# Patient Record
Sex: Male | Born: 1980 | Race: White | Hispanic: No | Marital: Married | Smoking: Former smoker
Health system: Southern US, Community
[De-identification: ages and names within clinical notes are randomized; demographics above are authoritative.]

## PROBLEM LIST (undated history)

## (undated) DIAGNOSIS — K219 Gastro-esophageal reflux disease without esophagitis: Secondary | ICD-10-CM

## (undated) DIAGNOSIS — J309 Allergic rhinitis, unspecified: Secondary | ICD-10-CM

## (undated) DIAGNOSIS — K602 Anal fissure, unspecified: Secondary | ICD-10-CM

## (undated) HISTORY — DX: Anal fissure, unspecified: K60.2

## (undated) HISTORY — DX: Gastro-esophageal reflux disease without esophagitis: K21.9

## (undated) HISTORY — DX: Allergic rhinitis, unspecified: J30.9

---
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
## 2008-09-25 HISTORY — PX: UPPER GI ENDOSCOPY: SHX6162

## 2010-02-07 ENCOUNTER — Encounter (INDEPENDENT_AMBULATORY_CARE_PROVIDER_SITE_OTHER): Payer: Self-pay | Admitting: *Deleted

## 2010-03-07 ENCOUNTER — Encounter (INDEPENDENT_AMBULATORY_CARE_PROVIDER_SITE_OTHER): Payer: Self-pay | Admitting: *Deleted

## 2010-05-05 ENCOUNTER — Encounter (INDEPENDENT_AMBULATORY_CARE_PROVIDER_SITE_OTHER): Payer: Self-pay | Admitting: *Deleted

## 2010-05-10 ENCOUNTER — Encounter (INDEPENDENT_AMBULATORY_CARE_PROVIDER_SITE_OTHER): Payer: Self-pay | Admitting: *Deleted

## 2010-05-17 ENCOUNTER — Ambulatory Visit: Payer: Self-pay | Admitting: Internal Medicine

## 2010-05-17 DIAGNOSIS — R1310 Dysphagia, unspecified: Secondary | ICD-10-CM | POA: Insufficient documentation

## 2010-05-17 DIAGNOSIS — R6889 Other general symptoms and signs: Secondary | ICD-10-CM

## 2010-05-17 DIAGNOSIS — K219 Gastro-esophageal reflux disease without esophagitis: Secondary | ICD-10-CM | POA: Insufficient documentation

## 2010-06-06 ENCOUNTER — Ambulatory Visit: Payer: Self-pay | Admitting: Internal Medicine

## 2010-06-10 ENCOUNTER — Encounter: Payer: Self-pay | Admitting: Internal Medicine

## 2010-10-25 NOTE — Letter (Signed)
Summary: Office Note-Dr E.Wilson  Office Note-Dr E.Wilson   Imported By: Lamona Curl CMA (AAMA) 05/17/2010 10:43:22  _____________________________________________________________________  External Attachment:    Type:   Image     Comment:   External Document

## 2010-10-25 NOTE — Letter (Signed)
Summary: Office Note- Dr E.Wilson  Office Note- Dr E.Wilson   Imported By: Lamona Curl CMA (AAMA) 05/17/2010 10:43:55  _____________________________________________________________________  External Attachment:    Type:   Image     Comment:   External Document

## 2010-10-25 NOTE — Letter (Signed)
Summary: Office Note-Dr E.Wilson  Office Note-Dr E.Wilson   Imported By: Lamona Curl CMA (AAMA) 05/17/2010 10:44:22  _____________________________________________________________________  External Attachment:    Type:   Image     Comment:   External Document

## 2010-10-25 NOTE — Letter (Signed)
Summary: New Patient letter  [facility]  [address] [place], [place] [address]   Phone: [phone]  Fax: [phone]       05/10/2010 MRN: [id number]  [facility] [address] [place], [place]  [address]  Dear Jesse Nolan,  Welcome to the Gastroenterology Division at [facility].    You are scheduled to see Dr. Christella Hartigan on 06-28-10 at 2:30p.m. on the 3rd floor at [facility], [address]  We ask that you try to arrive at our office 15 minutes prior to your appointment time to allow for check-in.  We would like you to complete the enclosed self-administered evaluation form prior to your visit and bring it with you on the day of your appointment.  We will review it with you.  Also, please bring a complete list of all your medications or, if you prefer, bring the medication bottles and we will list them.  Please bring your insurance card so that we may make a copy of it.  If your insurance requires a referral to see a specialist, please bring your referral form from your primary care physician.  Co-payments are due at the time of your visit and may be paid by cash, check or credit card.     Your office visit will consist of a consult with your physician (includes a physical exam), any laboratory testing he/she may order, scheduling of any necessary diagnostic testing (e.g. x-ray, ultrasound, CT-scan), and scheduling of a procedure (e.g. Endoscopy, Colonoscopy) if required.  Please allow enough time on your schedule to allow for any/all of these possibilities.    If you cannot keep your appointment, please call [phone] to cancel or reschedule prior to your appointment date.  This allows [place] the opportunity to schedule an appointment for another patient in need of care.  If you do not cancel or reschedule by 5 p.m. the business day prior to your appointment date, you will be charged a $50.00 late cancellation/no-show fee.    Thank you for choosing [facility] for your medical needs.  We appreciate the opportunity to care for you.  Please visit [place] at our website  to learn more about our practice.                     Sincerely,                                                             The Gastroenterology Division

## 2010-10-25 NOTE — Procedures (Signed)
Summary: Upper Endoscopy  Patient: [name] Note: All result statuses are Final unless otherwise noted.  Tests: (1) Upper Endoscopy (EGD)   EGD Upper Endoscopy       DONE (C)     [facility]     [address]     [place], [place]  [address]           ENDOSCOPY PROCEDURE REPORT           PATIENT:  Jesse Nolan, Jesse Nolan  MR#:  [id number]     BIRTHDATE:  [birth year], 29 yrs. old  GENDER:  male           ENDOSCOPIST:  Hedwig Morton. Juanda Chance, MD     Referred by:  [facility]           PROCEDURE DATE:  06/06/2010     PROCEDURE:  EGD with biopsy     ASA CLASS:  Class I     INDICATIONS:  lump senstation in the throat started after an     episode of vomiting ( too much EtOH). resolved on double dose of     PPI, returnes today, lifts heavy stuff at work, eats late at night                 MEDICATIONS:   Versed 8 mg, Fentanyl 75 mcg     TOPICAL ANESTHETIC:  Exactacain Spray           DESCRIPTION OF PROCEDURE:   After the risks benefits and     alternatives of the procedure were thoroughly explained, informed     consent was obtained.  The LB GIF-H180 [id number] endoscope was     introduced through the mouth and advanced to the second portion of     the duodenum, without limitations.  The instrument was slowly     withdrawn as the mucosa was fully examined.     <<PROCEDUREIMAGES>>           Duodenitis was found. patchy erosions duodenal bulb With standard     forceps, a biopsy was obtained and sent to pathology. r/o H (see     image2, image5, image6, and image7).Pylori  Otherwise the     examination was normal. With standard forceps, a biopsy was     obtained and sent to pathology (see image1 and image8). Bx normal     z-line    Retroflexed views revealed no abnormalities.    The     scope was then withdrawn from the patient and the procedure     completed.           COMPLICATIONS:  None           ENDOSCOPIC IMPRESSION:     1) Duodenitis     2) Otherwise normal  examination     RECOMMENDATIONS:     1) Await biopsy results     continue PPI's/ adjust the dose           REPEAT EXAM:  In 0 year(s) for.           ______________________________     Hedwig Morton. Juanda Chance, MD           CC: Josephina Shih, MD           n.     REVISED:  06/06/2010 04:02 PM     eSIGNED:   Hedwig Morton. Brodie at 06/06/2010 04:02 PM           [name],  [id number]  Note: An exclamation mark (!) indicates a result that was not dispersed into the flowsheet. Document Creation Date: 06/06/2010 4:03 PM _______________________________________________________________________  (1) Order result status: Final Collection or observation date-time: 06/06/2010 15:33 Requested date-time:  Receipt date-time:  Reported date-time:  Referring Physician:   Ordering Physician: Lina Sar [id number]) Specimen Source:  Source: [name] Order Number: [id number] Lab site:

## 2010-10-25 NOTE — Letter (Signed)
Summary: EGD Instructions  [facility]  [address] [place], [place] [address]   Phone: [phone]  Fax: [phone]       Jesse Nolan    06/17/1981    MRN: [id number]       Procedure Day /Date: Monday 06/06/10     Arrival Time: 2:00 pmx     Procedure Time: 3:00 pm     Location of Procedure:                    _ x _ [facility] (4th Floor)  PREPARATION FOR ENDOSCOPY   On 06/06/10 THE DAY OF THE PROCEDURE:  1.   No solid foods, milk or milk products are allowed after midnight the night before your procedure.  2.   Do not drink anything colored red or purple.  Avoid juices with pulp.  No orange juice.  3.  You may drink clear liquids until 1:00 pm, which is 2 hours before your procedure.                                                                                                CLEAR LIQUIDS INCLUDE: Water Jello Ice Popsicles Tea (sugar ok, no milk/cream) Powdered fruit flavored drinks Coffee (sugar ok, no milk/cream) Gatorade Juice: apple, white grape, white cranberry  Lemonade Clear bullion, consomm, broth Carbonated beverages (any kind) Strained chicken noodle soup Hard Candy   MEDICATION INSTRUCTIONS  Unless otherwise instructed, you should take regular prescription medications with a small sip of water as early as possible the morning of your procedure.                  OTHER INSTRUCTIONS  You will need a responsible adult at least 29 years of age to accompany you and drive you home.   This person must remain in the waiting room during your procedure.  Wear loose fitting clothing that is easily removed.  Leave jewelry and other valuables at home.  However, you may wish to bring a book to read or an iPod/MP3 player to listen to music as you wait for your procedure to start.  Remove all body piercing jewelry and leave at home.  Total time from sign-in until discharge is approximately 2-3 hours.  You should go  home directly after your procedure and rest.  You can resume normal activities the day after your procedure.  The day of your procedure you should not:   Drive   Make legal decisions   Operate machinery   Drink alcohol   Return to work  You will receive specific instructions about eating, activities and medications before you leave.    The above instructions have been reviewed and explained to me by   Lamona Curl CMA Duncan Dull)  May 17, 2010 1:15 PM     I fully understand and can verbalize these instructions _____________________________ Date 05/17/10

## 2010-10-25 NOTE — Letter (Signed)
Summary: Patient [facility] Biopsy Results  [facility]  [address] [place], [place] [address]   Phone: [phone]  Fax: [phone]        June 10, 2010 MRN: [id number]    ALDON HENGST [address] [place], [place]  [address]    Dear Mr. Randol,  I am pleased to inform you that the biopsies taken during your recent endoscopic examination did not show any evidence of cancer upon pathologic examination.it showed mild inflammation in the duodenum.  Additional information/recommendations:  __No further action is needed at this time.  Please follow-up with      your primary care physician for your other healthcare needs.  __ Please call [phone] to schedule a return visit to review      your condition.  _x_ Continue with the treatment plan as outlined on the day of your      exam.  _   Please call us if you are having persistent problems or have questions about your condition that have not been fully answered at this time.  Sincerely,  Hart Carwin MD  This letter has been electronically signed by your physician.  Appended Document: Patient Notice-Endo Biopsy Results letter mailed

## 2010-10-25 NOTE — Assessment & Plan Note (Signed)
Summary: dysphagia   History of Present Illness Visit Type: new patient  Primary GI MD: Lina Sar MD Primary Provider: [facility]  Requesting Provider: na Chief Complaint: Feels like something is stuck in his throat when he swallows  History of Present Illness:   30 y.o WM with several month hx of "lump " in his throat, which developed after an episode of forcefull vomiting in March 2011 after having a lot to drink. He initially responded to Omeprazole 20 mg once daily, but later signs's returned and have not responder to another course of prilosec. He has been evaluated by Dr Rosamaria Lints specialist. He denies dysphagia. Pt denies taking any NSAID's, he usually eats late at night and goes to bed within 2 hours of eating supper. 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 He was referred to see a GI specialist for possible EGD.   GI Review of Systems      Denies abdominal pain, acid reflux, belching, bloating, chest pain, dysphagia with liquids, dysphagia with solids, heartburn, loss of appetite, nausea, vomiting, vomiting blood, weight loss, and  weight gain.        Denies anal fissure, black tarry stools, change in bowel habit, constipation, diarrhea, diverticulosis, fecal incontinence, heme positive stool, hemorrhoids, irritable bowel syndrome, jaundice, light color stool, liver problems, rectal bleeding, and  rectal pain.    Current Medications (verified): 1)  Omeprazole 20 Mg Cpdr (Omeprazole) .... Take 1 Tablet By Mouth Once Daily  Allergies (verified): No Known Drug Allergies  Past History:  Past Medical History: DYSPHAGIA UNSPECIFIED (ICD-787.20) OTHER SYMPTOMS INVOLVING HEAD AND NECK (ICD-784.99) GERD (ICD-530.81)    Past Surgical History: Wisdom Teeth Wisdom   Family History: Family History of Colon Cancer:  ? Maternal Aunt Family History of Diabetes: Maternal Aunt Family History of Heart Disease:  Father  Social History: Married No childern Patient has never smoked. ---did "dip" until 1 month ago Occupation: Financial planner (Marketing executive) Alcohol Use - yes: one daily  Daily Caffeine Use: one daily  Illicit Drug Use - no Drug Use:  no  Review of Systems       The patient complains of fever, skin rash, and sleeping problems.  The patient denies allergy/sinus, anemia, anxiety-new, arthritis/joint pain, back pain, blood in urine, breast changes/lumps, change in vision, confusion, cough, coughing up blood, depression-new, fainting, fatigue, headaches-new, hearing problems, heart murmur, heart rhythm changes, itching, muscle pains/cramps, night sweats, nosebleeds, shortness of breath, sore throat, swelling of feet/legs, swollen lymph glands, thirst - excessive, urination - excessive, urination changes/pain, urine leakage, vision changes, and voice change.         Pertinent positive and negative review of systems were noted in the above HPI. All other ROS was otherwise negative.   Vital Signs:  Patient profile:   30 year old male Height:      74 inches Weight:      269 pounds BMI:     34.66 BSA:     2.47 Temp:     98.7 degrees F oral Pulse rate:   88 / minute Pulse rhythm:   regular BP sitting:   144 / 76  (left arm) Cuff size:   regular  Vitals Entered By: Ok Anis CMA (May 17, 2010 12:25 PM)  Physical Exam  General:  Well developed, well nourished, no acute distress., mildly overweight Eyes:  PERRLA, no icterus. Mouth:  No deformity or lesions, dentition normal. Neck:  Supple; no masses  or thyromegaly. Lungs:  Clear throughout to auscultation. Heart:  Regular rate and rhythm; no murmurs, rubs,  or bruits. Abdomen:  Soft, nontender and nondistended. No masses, hepatosplenomegaly or hernias noted. Normal bowel sounds. Extremities:  No clubbing, cyanosis, edema or deformities noted. Skin:  Intact without significant lesions or rashes. Psych:  Alert and cooperative.  Normal mood and affect.   Impression & Recommendations:  Problem # 1:  DYSPHAGIA UNSPECIFIED (ICD-787.20) globus sensation in the back of his throat, no true dysphadia, possible related to LPR, possibly due to silent nocturnal reflux. R/o Barrett's esophagus, hiatal hernia, Only partiao response to PPI. Will increase prilosec to 20 mg am and 40 mg pm and schedule pt for EGD abd biopsies. Antireflux measures discussed with the pt and his mother. Orders: EGD (EGD)  Patient Instructions: 1)  You have been scheduled for an endoscopy on 06/06/10 @ 3 pm. Please arrive at 2 pm for registration. You have written instructions for your prep. 2)  We have sent a new prescription for omeprazole to your pharmacy for you to pick up. You should take 1/2 tablet (20 mg) every morning and 1 tablet (40 mg) every night. We have given you some samples as well. 3)  Copy sent to : [facility] 4)  The medication list was reviewed and reconciled.  All changed / newly prescribed medications were explained.  A complete medication list was provided to the patient / caregiver. Prescriptions: PRILOSEC 40 MG CPDR (OMEPRAZOLE) Take 1/2 tablet by mouth by mouth every morning and 1 tablet by mouth at bedtime  #45 x 2   Entered by:   Lamona Curl CMA (AAMA)   Authorized by:   Hart Carwin MD   Signed by:   Lamona Curl CMA (AAMA) on 05/17/2010   Method used:   Electronically to        [facility]  [address] [id number] (retail)       [address]       [place], [place]  [address]       Ph: [phone] or [phone]       Fax: [phone]   RxID:   [id number]

## 2010-11-10 ENCOUNTER — Emergency Department (HOSPITAL_COMMUNITY): Payer: BC Managed Care – PPO

## 2010-11-10 ENCOUNTER — Emergency Department (HOSPITAL_COMMUNITY)
Admission: EM | Admit: 2010-11-10 | Discharge: 2010-11-10 | Disposition: A | Discharge status: Home / Self Care | Payer: BC Managed Care – PPO | Attending: Emergency Medicine | Admitting: Emergency Medicine

## 2010-11-10 DIAGNOSIS — R079 Chest pain, unspecified: Secondary | ICD-10-CM | POA: Insufficient documentation

## 2010-11-10 DIAGNOSIS — R091 Pleurisy: Secondary | ICD-10-CM | POA: Insufficient documentation

## 2011-09-26 HISTORY — PX: COLONOSCOPY: SHX5424

## 2013-01-03 ENCOUNTER — Ambulatory Visit (HOSPITAL_COMMUNITY)
Admission: RE | Admit: 2013-01-03 | Discharge: 2013-01-03 | Disposition: A | Discharge status: Home / Self Care | Payer: BC Managed Care – PPO | Source: Ambulatory Visit | Attending: Family Medicine | Admitting: Family Medicine

## 2013-01-03 ENCOUNTER — Other Ambulatory Visit (HOSPITAL_COMMUNITY): Payer: Self-pay | Admitting: Family Medicine

## 2013-01-03 DIAGNOSIS — R05 Cough: Secondary | ICD-10-CM

## 2013-01-03 DIAGNOSIS — R059 Cough, unspecified: Secondary | ICD-10-CM | POA: Insufficient documentation

## 2013-02-24 ENCOUNTER — Other Ambulatory Visit (HOSPITAL_COMMUNITY): Payer: Self-pay | Admitting: Family Medicine

## 2013-02-24 DIAGNOSIS — J209 Acute bronchitis, unspecified: Secondary | ICD-10-CM

## 2013-02-24 DIAGNOSIS — R0789 Other chest pain: Secondary | ICD-10-CM

## 2013-02-27 ENCOUNTER — Encounter (HOSPITAL_COMMUNITY): Payer: Self-pay

## 2013-02-27 ENCOUNTER — Ambulatory Visit (HOSPITAL_COMMUNITY): Payer: BC Managed Care – PPO

## 2013-02-27 ENCOUNTER — Ambulatory Visit (HOSPITAL_COMMUNITY)
Admission: RE | Admit: 2013-02-27 | Discharge: 2013-02-27 | Disposition: A | Discharge status: Home / Self Care | Payer: BC Managed Care – PPO | Source: Ambulatory Visit | Attending: Family Medicine | Admitting: Family Medicine

## 2013-02-27 DIAGNOSIS — J209 Acute bronchitis, unspecified: Secondary | ICD-10-CM | POA: Insufficient documentation

## 2013-02-27 DIAGNOSIS — R0789 Other chest pain: Secondary | ICD-10-CM

## 2013-02-27 DIAGNOSIS — R079 Chest pain, unspecified: Secondary | ICD-10-CM | POA: Insufficient documentation

## 2013-02-27 MED ORDER — IOHEXOL 300 MG/ML  SOLN
80.0000 mL | Freq: Once | INTRAMUSCULAR | Status: AC | PRN
  Administered 2013-02-27: 80 mL via INTRAVENOUS

## 2014-01-30 ENCOUNTER — Ambulatory Visit (INDEPENDENT_AMBULATORY_CARE_PROVIDER_SITE_OTHER): Payer: PRIVATE HEALTH INSURANCE | Admitting: General Surgery

## 2014-01-30 ENCOUNTER — Encounter (INDEPENDENT_AMBULATORY_CARE_PROVIDER_SITE_OTHER): Payer: Self-pay | Admitting: General Surgery

## 2014-01-30 VITALS — BP 132/88 | HR 80 | Temp 97.4°F | Resp 14 | Ht 73.0 in | Wt 264.0 lb

## 2014-01-30 DIAGNOSIS — R1031 Right lower quadrant pain: Secondary | ICD-10-CM | POA: Insufficient documentation

## 2014-01-30 NOTE — Patient Instructions (Signed)
Will call with results of CT 

## 2014-01-30 NOTE — Progress Notes (Signed)
Patient ID: Jesse Nolan, male   DOB: December 26, 1980, 33 y.o.   MRN: [id number]  Chief Complaint  Patient presents with  . New Evaluation    eval RIH    HPI Jesse Nolan is a 33 y.o. male.  We are asked to see the patient in consultation by Dr. Terie PurserSamantha Jackson to evaluate him for a right inguinal hernia. The patient is a 33 year old white male who presents with some right groin pain that started in January. He first noticed this after splitting some blood. He has not noticed a bulge in the right groin. He denies any nausea or vomiting. Over the last couple weeks the pain has resolved and he feels pretty normal. His appetite is good and his bowels are working normally  HPI  Past Medical History  Diagnosis Date  . GERD (gastroesophageal reflux disease)     Past Surgical History  Procedure Laterality Date  . Colonoscopy  2013    Dr. Candelaria StagersMeadoff  . Wisdom tooth extraction  2002  . Upper gi endoscopy  2010    Dr. Dickie LaBrody    History reviewed. No pertinent family history.  Social History History  Substance Use Topics  . Smoking status: Former Smoker    Quit date: 01/30/2005  . Smokeless tobacco: Not on file  . Alcohol Use: 3.0 - 3.6 oz/week    5-6 Cans of beer per week     Comment: weekly    No Known Allergies  Current Outpatient Prescriptions  Medication Sig Dispense Refill  . cetirizine (ZYRTEC) 10 MG tablet Take 10 mg by mouth daily.      Marland Kitchen. KRILL OIL OMEGA-3 PO Take by mouth.      . Multiple Vitamins-Minerals (MULTIVITAMIN WITH MINERALS) tablet Take 1 tablet by mouth daily.      Marland Kitchen. omeprazole (PRILOSEC) 40 MG capsule        No current facility-administered medications for this visit.    Review of Systems Review of Systems  Constitutional: Negative.   HENT: Negative.   Eyes: Negative.   Respiratory: Negative.   Cardiovascular: Negative.   Gastrointestinal: Negative.   Endocrine: Negative.   Genitourinary: Negative.   Musculoskeletal: Negative.   Skin: Negative.    Allergic/Immunologic: Negative.   Neurological: Negative.   Hematological: Negative.   Psychiatric/Behavioral: Negative.     Blood pressure 132/88, pulse 80, temperature 97.4 F (36.3 C), temperature source Temporal, resp. rate 14, height 6\' 1"  (1.854 m), weight 264 lb (119.75 kg).  Physical Exam Physical Exam  Constitutional: He is oriented to person, place, and time. He appears well-developed and well-nourished.  HENT:  Head: Normocephalic and atraumatic.  Eyes: Conjunctivae and EOM are normal. Pupils are equal, round, and reactive to light.  Neck: Normal range of motion. Neck supple.  Cardiovascular: Normal rate, regular rhythm and normal heart sounds.   Pulmonary/Chest: Effort normal and breath sounds normal.  Abdominal: Soft. Bowel sounds are normal.  Genitourinary:  There is no palpable bulge in either groin. There is no palpable impulse with straining in either groin.  Musculoskeletal: Normal range of motion.  Neurological: He is alert and oriented to person, place, and time.  Skin: Skin is warm and dry.  Psychiatric: He has a normal mood and affect. His behavior is normal.    Data Reviewed As above  Assessment    The patient has had some right groin discomfort. Clinically I cannot appreciate an inguinal hernia today. Because of this I would recommend getting a limited CT of the pelvis to  look for any radiographic evidence of a hernia. I discussed with him in detail the risks and benefits of the operation to fix her hernia as well as some of the technical aspects including the use of mesh and he understands     Plan    Plan for limited CT of the pelvis. We will call him with the results of the study and then proceed accordingly        Caleen EssexPaul S Toth III 01/30/2014, 4:49 PM

## 2014-02-02 ENCOUNTER — Telehealth (INDEPENDENT_AMBULATORY_CARE_PROVIDER_SITE_OTHER): Payer: Self-pay | Admitting: *Deleted

## 2014-02-02 NOTE — Telephone Encounter (Signed)
Pt wife, Lupe CarneyRebekah, called back regarding the message that was left.  I relayed the apt information to her regarding the CT and she stated that he couldn't do anything until after 4:00 p.m   I provided her with GI phone number and she will call and reschedule to best suit the patient.  Victorino DikeJennifer

## 2014-02-02 NOTE — Telephone Encounter (Signed)
LM for pt to return my call.  Please advise pt of his appt for CT Pelvis without contrast @ [facility] 02/04/14 @ 1:45.  Pt needs to drink 1st bottle of contrast at 12:00 and second bottle at 1:00.  Please advise pt no solid foods 4 hours prior to test.  Thanks!  Jesse DikeJennifer

## 2014-02-04 ENCOUNTER — Ambulatory Visit
Admission: RE | Admit: 2014-02-04 | Discharge: 2014-02-04 | Disposition: A | Discharge status: Home / Self Care | Payer: BC Managed Care – PPO | Source: Ambulatory Visit | Attending: General Surgery | Admitting: General Surgery

## 2014-02-04 ENCOUNTER — Inpatient Hospital Stay: Admission: RE | Admit: 2014-02-04 | Payer: BC Managed Care – PPO | Source: Ambulatory Visit

## 2014-02-04 DIAGNOSIS — R1031 Right lower quadrant pain: Secondary | ICD-10-CM

## 2014-02-09 ENCOUNTER — Telehealth (INDEPENDENT_AMBULATORY_CARE_PROVIDER_SITE_OTHER): Payer: Self-pay

## 2014-02-09 NOTE — Telephone Encounter (Signed)
Message copied by Brennan BaileyBROOKS, Satina Jerrell on Mon Feb 09, 2014  4:17 PM ------      Message from: Caleen EssexTH, PAUL S III      Created: Thu Feb 05, 2014  1:29 PM       No hernia seen ------

## 2014-02-09 NOTE — Telephone Encounter (Signed)
Called pt with Ct report

## 2014-05-11 ENCOUNTER — Encounter: Payer: Self-pay | Admitting: Internal Medicine

## 2015-02-15 ENCOUNTER — Emergency Department (HOSPITAL_COMMUNITY): Payer: BLUE CROSS/BLUE SHIELD

## 2015-02-15 ENCOUNTER — Encounter (HOSPITAL_COMMUNITY): Payer: Self-pay | Admitting: *Deleted

## 2015-02-15 ENCOUNTER — Emergency Department (HOSPITAL_COMMUNITY)
Admission: EM | Admit: 2015-02-15 | Discharge: 2015-02-15 | Disposition: A | Discharge status: Home / Self Care | Payer: BLUE CROSS/BLUE SHIELD | Attending: Emergency Medicine | Admitting: Emergency Medicine

## 2015-02-15 DIAGNOSIS — M549 Dorsalgia, unspecified: Secondary | ICD-10-CM | POA: Insufficient documentation

## 2015-02-15 DIAGNOSIS — Z87891 Personal history of nicotine dependence: Secondary | ICD-10-CM | POA: Insufficient documentation

## 2015-02-15 DIAGNOSIS — Z79899 Other long term (current) drug therapy: Secondary | ICD-10-CM | POA: Insufficient documentation

## 2015-02-15 DIAGNOSIS — R079 Chest pain, unspecified: Secondary | ICD-10-CM | POA: Diagnosis present

## 2015-02-15 DIAGNOSIS — K219 Gastro-esophageal reflux disease without esophagitis: Secondary | ICD-10-CM | POA: Diagnosis not present

## 2015-02-15 LAB — CBC WITH DIFFERENTIAL/PLATELET
BASOS ABS: 0 10*3/uL (ref 0.0–0.1)
BASOS PCT: 0 % (ref 0–1)
EOS ABS: 0.1 10*3/uL (ref 0.0–0.7)
EOS PCT: 1 % (ref 0–5)
HCT: 46.4 % (ref 39.0–52.0)
Hemoglobin: 16.1 g/dL (ref 13.0–17.0)
LYMPHS PCT: 43 % (ref 12–46)
Lymphs Abs: 4.2 10*3/uL — ABNORMAL HIGH (ref 0.7–4.0)
MCH: 32 pg (ref 26.0–34.0)
MCHC: 34.7 g/dL (ref 30.0–36.0)
MCV: 92.2 fL (ref 78.0–100.0)
MONO ABS: 0.6 10*3/uL (ref 0.1–1.0)
Monocytes Relative: 6 % (ref 3–12)
NEUTROS PCT: 50 % (ref 43–77)
Neutro Abs: 4.8 10*3/uL (ref 1.7–7.7)
Platelets: 215 10*3/uL (ref 150–400)
RBC: 5.03 MIL/uL (ref 4.22–5.81)
RDW: 12.1 % (ref 11.5–15.5)
WBC: 9.8 10*3/uL (ref 4.0–10.5)

## 2015-02-15 LAB — BASIC METABOLIC PANEL
ANION GAP: 6 (ref 5–15)
BUN: 17 mg/dL (ref 6–20)
CHLORIDE: 105 mmol/L (ref 101–111)
CO2: 28 mmol/L (ref 22–32)
CREATININE: 1.06 mg/dL (ref 0.61–1.24)
Calcium: 9 mg/dL (ref 8.9–10.3)
GLUCOSE: 99 mg/dL (ref 65–99)
POTASSIUM: 4 mmol/L (ref 3.5–5.1)
SODIUM: 139 mmol/L (ref 135–145)

## 2015-02-15 LAB — TROPONIN I

## 2015-02-15 MED ORDER — TRAMADOL HCL 50 MG PO TABS: 50.0000 mg | ORAL_TABLET | Freq: Four times a day (QID) | ORAL | Status: DC | PRN

## 2015-02-15 MED ORDER — NAPROXEN 500 MG PO TABS: 500.0000 mg | ORAL_TABLET | Freq: Two times a day (BID) | ORAL | Status: DC

## 2015-02-15 NOTE — ED Notes (Signed)
Pt states he has had chest pain and tightness x 1 week. Pt states his chest feels tight and he will have a pain in his chest that radiates to his left shoulder.

## 2015-02-15 NOTE — Discharge Instructions (Signed)

## 2015-02-15 NOTE — ED Provider Notes (Signed)
CSN: [id number]     Arrival date & time 02/15/15  2122 History  This chart was scribed for Jesse Nolan January, MD by Doreatha MartinEva Mathews, ED Scribe. This patient was seen in room APA05/APA05 and the patient's care was started at 9:33 PM.     No chief complaint on file.  The history is provided by the patient. No language interpreter was used.   HPI Comments: Jesse Nolan is a 34 y.o. male with hx of pleurisy who presents to the Emergency Department complaining of intermittent, non-exertional CP that radiates to the left shoulder and upper back and began one week ago. He reports that pain is worsened in supine position and with palpation. He states that he takes Training and development officerKrill Oil and 40mg  Prilosec daily. Pt has a history of pleurisy and states current symptoms are similar, but that prior episode occurred after a URI. Pt is a former smoker. He denies FHx of CAD<55 y.o. Pt also denies SOB, cough and cold symptoms.  Past Medical History  Diagnosis Date  . GERD (gastroesophageal reflux disease)    Past Surgical History  Procedure Laterality Date  . Colonoscopy  2013    Dr. Candelaria StagersMeadoff  . Wisdom tooth extraction  2002  . Upper gi endoscopy  2010    Dr. Dickie LaBrody   No family history on file. History  Substance Use Topics  . Smoking status: Former Smoker    Quit date: 01/30/2005  . Smokeless tobacco: Not on file  . Alcohol Use: 3.0 - 3.6 oz/week    5-6 Cans of beer per week     Comment: weekly    Review of Systems  HENT: Negative for congestion.   Respiratory: Negative for cough and shortness of breath.   Cardiovascular: Positive for chest pain.  Musculoskeletal: Positive for back pain.  All other systems reviewed and are negative.     Allergies  Review of patient's allergies indicates no known allergies.  Home Medications   Prior to Admission medications   Medication Sig Start Date End Date Taking? Authorizing Provider  cetirizine (ZYRTEC) 10 MG tablet Take 10 mg by mouth daily.    Historical  Provider, MD  KRILL OIL OMEGA-3 PO Take by mouth.    Historical Provider, MD  Multiple Vitamins-Minerals (MULTIVITAMIN WITH MINERALS) tablet Take 1 tablet by mouth daily.    Historical Provider, MD  omeprazole (PRILOSEC) 40 MG capsule  01/22/14   Historical Provider, MD   BP 141/84 mmHg  Pulse 69  Temp(Src) 98 F (36.7 C) (Oral)  Resp 20  Ht 6\' 2"  (1.88 m)  Wt 260 lb (117.935 kg)  BMI 33.37 kg/m2  SpO2 99% Physical Exam  Constitutional: He is oriented to person, place, and time. He appears well-developed and well-nourished. No distress.  HENT:  Head: Normocephalic and atraumatic.  Right Ear: Hearing normal.  Left Ear: Hearing normal.  Nose: Nose normal.  Mouth/Throat: Oropharynx is clear and moist and mucous membranes are normal.  Eyes: Conjunctivae and EOM are normal. Pupils are equal, round, and reactive to light.  Neck: Normal range of motion. Neck supple.  Cardiovascular: Regular rhythm.  Exam reveals no gallop and no friction rub.   No murmur heard. Pulmonary/Chest: Effort normal and breath sounds normal. No respiratory distress. He exhibits tenderness.  Tenderness over the left anterior chest.   Abdominal: Soft. Normal appearance and bowel sounds are normal. There is no hepatosplenomegaly. There is no tenderness. There is no rebound, no guarding, no tenderness at McBurney's point and negative Murphy's sign.  No hernia.  Musculoskeletal: Normal range of motion.  Neurological: He is alert and oriented to person, place, and time. He has normal strength. No cranial nerve deficit or sensory deficit. Coordination normal. GCS eye subscore is 4. GCS verbal subscore is 5. GCS motor subscore is 6.  Skin: Skin is warm and dry. No rash noted.  Psychiatric: He has a normal mood and affect. His behavior is normal. Thought content normal.  Nursing note and vitals reviewed.   ED Course  Procedures (including critical care time) DIAGNOSTIC STUDIES: Oxygen Saturation is 99% on RA, normal by  my interpretation.    COORDINATION OF CARE: 9:41 PM Discussed normal EKG and treatment plan with pt at bedside which includes blood work and diagnostic chest imaging. Pt agreed to plan.   Labs Review Labs Reviewed  CBC WITH DIFFERENTIAL/PLATELET - Abnormal; Notable for the following:    Lymphs Abs 4.2 (*)    All other components within normal limits  BASIC METABOLIC PANEL  TROPONIN I    Imaging Review Dg Chest 2 View  02/15/2015   CLINICAL DATA:  Intermittent central CP with radiation into LT shoulder x 1 wk. No known injury. Pt states he has been working more hours than normal at his job. Hx GERD, former smoker  EXAM: CHEST  2 VIEW  COMPARISON:  01/03/2013.  FINDINGS: The heart size and mediastinal contours are within normal limits. Both lungs are clear. No pleural effusion or pneumothorax. The visualized skeletal structures are unremarkable.  IMPRESSION: Normal chest radiographs.   Electronically Signed   By: Amie Portland M.D.   On: 02/15/2015 22:18     EKG Interpretation   Date/Time:  Monday Feb 15 2015 21:35:30 EDT Ventricular Rate:  64 PR Interval:  141 QRS Duration: 68 QT Interval:  380 QTC Calculation: 392 R Axis:   52 Text Interpretation:  Sinus rhythm Normal ECG Confirmed by Chapman Matteucci  MD,  Avenly Roberge [phone]) on 02/15/2015 9:55:26 PM      MDM   Final diagnoses:  Chest pain   chest wall pain  Patient presents to the ER for evaluation of chest pain. He has been experiencing pain for a week. He reports some tightness in his chest as well as a sharp pain in the left upper chest wall area. This is reproducible with palpation. Patient has no cardiac risk factors currently. This includes no family history of heart disease.HEART score is 1, with only positive for being BMI greater than 30 (calculated as 33). Patient is felt to be extremely low risk for cardiac etiology. In addition to his risk factors, he has very reproducible pain. Patient will be discharged with treatment  for chest wall pain. Follow-up with primary doctor.   Jesse Crease, MD 02/15/15 2228

## 2019-07-24 ENCOUNTER — Ambulatory Visit
Admission: EM | Admit: 2019-07-24 | Discharge: 2019-07-24 | Disposition: A | Discharge status: Home / Self Care | Payer: 59

## 2019-07-24 ENCOUNTER — Other Ambulatory Visit: Payer: Self-pay

## 2019-07-24 ENCOUNTER — Ambulatory Visit (INDEPENDENT_AMBULATORY_CARE_PROVIDER_SITE_OTHER): Payer: BLUE CROSS/BLUE SHIELD

## 2019-07-24 DIAGNOSIS — W231XXA Caught, crushed, jammed, or pinched between stationary objects, initial encounter: Secondary | ICD-10-CM | POA: Diagnosis not present

## 2019-07-24 DIAGNOSIS — S60932A Unspecified superficial injury of left thumb, initial encounter: Secondary | ICD-10-CM | POA: Diagnosis not present

## 2019-07-24 DIAGNOSIS — S6992XA Unspecified injury of left wrist, hand and finger(s), initial encounter: Secondary | ICD-10-CM | POA: Diagnosis not present

## 2019-07-24 DIAGNOSIS — S6702XA Crushing injury of left thumb, initial encounter: Secondary | ICD-10-CM | POA: Diagnosis not present

## 2019-07-24 DIAGNOSIS — S61309A Unspecified open wound of unspecified finger with damage to nail, initial encounter: Secondary | ICD-10-CM

## 2019-07-24 DIAGNOSIS — Z23 Encounter for immunization: Secondary | ICD-10-CM | POA: Diagnosis not present

## 2019-07-24 MED ORDER — MUPIROCIN 2 % EX OINT: 1.0000 "application " | TOPICAL_OINTMENT | Freq: Two times a day (BID) | CUTANEOUS | 0 refills | Status: DC

## 2019-07-24 MED ORDER — TETANUS-DIPHTH-ACELL PERTUSSIS 5-2.5-18.5 LF-MCG/0.5 IM SUSP
0.5000 mL | Freq: Once | INTRAMUSCULAR | Status: AC
  Administered 2019-07-24: 0.5 mL via INTRAMUSCULAR

## 2019-07-24 NOTE — Discharge Instructions (Signed)
X-rays did not show fracture Partial nail removal Wet dressing and splint applied Tetanus updated Continue conservative management of rest, ice, and elevation Mupirocin ointment prescribed.  Use as directed to help prevent infection Follow up with PCP as needed Return or go to the ER if you have any new or worsening symptoms (fever, chills, increased redness, swelling, drainage, symptoms do not improve, etc...)

## 2019-07-24 NOTE — ED Provider Notes (Signed)
[facility]   [id number] 07/24/19 Arrival Time: 1915  CC: Left thumb pain  SUBJECTIVE: History from: patient. Jesse Nolan is a 38 y.o. male complains of left thumb injury that occurred 2 hours ago.  Symptoms began after smashing his thumb while hooking a hitch to a tractor.  Localizes the pain to the left thumb.  Describes the pain as intermittent and "numb" in character.  Wrapped it up prior to arrival. Symptoms are made worse to the touch.  Complains of similar symptoms in the past and did not seek intervention at that time.  Complains of associated bleeding, swelling, and bruising.  Bleeding controlled now.  Denies fever, chills, weakness, tingling.    Tetanus 8 years ago  ROS: As per HPI.  All other pertinent ROS negative.     Past Medical History:  Diagnosis Date  . GERD (gastroesophageal reflux disease)    Past Surgical History:  Procedure Laterality Date  . COLONOSCOPY  2013   Dr. Candelaria Stagers  . UPPER GI ENDOSCOPY  2010   Dr. Dickie La  . WISDOM TOOTH EXTRACTION  2002   No Known Allergies No current facility-administered medications on file prior to encounter.    Current Outpatient Medications on File Prior to Encounter  Medication Sig Dispense Refill  . cetirizine (ZYRTEC) 10 MG tablet Take 10 mg by mouth daily.    Marland Kitchen KRILL OIL OMEGA-3 PO Take by mouth.    . Multiple Vitamins-Minerals (MULTIVITAMIN WITH MINERALS) tablet Take 1 tablet by mouth daily.    . pantoprazole (PROTONIX) 40 MG tablet Take 40 mg by mouth daily.    . [DISCONTINUED] omeprazole (PRILOSEC) 40 MG capsule      Social History   Socioeconomic History  . Marital status: Married    Spouse name: Not on file  . Number of children: Not on file  . Years of education: Not on file  . Highest education level: Not on file  Occupational History  . Not on file  Social Needs  . Financial resource strain: Not on file  . Food insecurity    Worry: Not on file    Inability: Not on file  . Transportation  needs    Medical: Not on file    Non-medical: Not on file  Tobacco Use  . Smoking status: Former Smoker    Quit date: 01/30/2005    Years since quitting: 14.4  . Smokeless tobacco: Never Used  Substance and Sexual Activity  . Alcohol use: Yes    Alcohol/week: 5.0 - 6.0 standard drinks    Types: 5 - 6 Cans of beer per week    Comment: weekly  . Drug use: No  . Sexual activity: Not on file  Lifestyle  . Physical activity    Days per week: Not on file    Minutes per session: Not on file  . Stress: Not on file  Relationships  . Social Musician on phone: Not on file    Gets together: Not on file    Attends religious service: Not on file    Active member of club or organization: Not on file    Attends meetings of clubs or organizations: Not on file    Relationship status: Not on file  . Intimate partner violence    Fear of current or ex partner: Not on file    Emotionally abused: Not on file    Physically abused: Not on file    Forced sexual activity: Not on file  Other Topics Concern  . Not on file  Social History Narrative  . Not on file   History reviewed. No pertinent family history.  OBJECTIVE:  Vitals:   07/24/19 1928 07/24/19 1937  BP: (!) 154/96   Pulse: 69   Resp: 18   Temp: 98.9 F (37.2 C)   SpO2: (!) 69% 96%    General appearance: ALERT; in no acute distress.  Head: NCAT Lungs: Normal respiratory effort CV: Radial pulses 2+ bilaterally. Cap refill < 2 seconds Musculoskeletal: Left thumb Inspection: Subungual hematoma with partial lateral nail bed avulsion Palpation: TTP about the distal phalanx of the left first digit, NTTP over DIP joint ROM: FROM active and passive Strength: deferred Skin: warm and dry Neurologic: Ambulates without difficulty; Sensation intact about the upper extremities Psychological: alert and cooperative; normal mood and affect  DIAGNOSTIC STUDIES:  Dg Finger Thumb Left  Result Date: 07/24/2019 CLINICAL DATA:   Crush injury to the thumb, initial encounter EXAM: LEFT THUMB 2+V COMPARISON:  None. FINDINGS: Mild degenerative changes of the interphalangeal joint are seen. No acute fracture or dislocation is seen. No gross soft tissue abnormality is noted. IMPRESSION: No acute abnormality noted. Electronically Signed   By: Inez Catalina M.D.   On: 07/24/2019 19:45     X-rays negative for bony abnormalities including fracture, or dislocation.  No soft tissue swelling.    I have reviewed the x-rays myself and the radiologist interpretation. I am in agreement with the radiologist interpretation.     PROCEDURE:  The area over fingernail prepped and draped in sterile fashion. A digital block was performed using apx 6 cc of Lidocaine 2% without epinephrine for local anesthesia. Lateral proximal nail fold already elevated secondary to injury.  Wedge excision of separated nail performed without complication. Hemostasis obtained. Dressing was applied to the area. May remove in 24 hours. Anticipatory guidance and standard post-procedure care was explained. Return precautions are given. The patient tolerated the procedure well. Follow up in 48-72 hours for recheck. Ibuprofen or Tylenol as needed for discomfort.  ASSESSMENT & PLAN:  1. Injury of left thumb, initial encounter   2. Avulsion of fingernail, initial encounter    Meds ordered this encounter  Medications  . Tdap (BOOSTRIX) injection 0.5 mL  . mupirocin ointment (BACTROBAN) 2 %    Sig: Place 1 application into the nose 2 (two) times daily.    Dispense:  22 g    Refill:  0    Order Specific Question:   Supervising Provider    Answer:   Raylene Everts [[id number]]   X-rays did not show fracture Partial nail removal Wet dressing and splint applied Tetanus updated Continue conservative management of rest, ice, and elevation Mupirocin ointment prescribed.  Use as directed to help prevent infection Follow up with PCP as needed Return or go to the ER if  you have any new or worsening symptoms (fever, chills, increased redness, swelling, drainage, symptoms do not improve, etc...)   Reviewed expectations re: course of current medical issues. Questions answered. Outlined signs and symptoms indicating need for more acute intervention. Patient verbalized understanding. After Visit Summary given.    Lestine Box, PA-C 07/25/19 1013

## 2019-07-24 NOTE — ED Triage Notes (Signed)
Pt has thumb injury that occurred this evening. Pt injured while hooking hitch to tractor , bleeding controlled

## 2019-08-18 ENCOUNTER — Encounter: Payer: Self-pay | Admitting: Internal Medicine

## 2019-08-18 ENCOUNTER — Ambulatory Visit (INDEPENDENT_AMBULATORY_CARE_PROVIDER_SITE_OTHER): Payer: 59 | Admitting: Internal Medicine

## 2019-08-18 VITALS — BP 192/108 | HR 84 | Temp 98.8°F | Ht 74.0 in | Wt 263.0 lb

## 2019-08-18 DIAGNOSIS — K2100 Gastro-esophageal reflux disease with esophagitis, without bleeding: Secondary | ICD-10-CM

## 2019-08-18 DIAGNOSIS — Z1159 Encounter for screening for other viral diseases: Secondary | ICD-10-CM

## 2019-08-18 DIAGNOSIS — R0989 Other specified symptoms and signs involving the circulatory and respiratory systems: Secondary | ICD-10-CM | POA: Diagnosis not present

## 2019-08-18 DIAGNOSIS — R09A2 Foreign body sensation, throat: Secondary | ICD-10-CM

## 2019-08-18 NOTE — Progress Notes (Signed)
HISTORY OF PRESENT ILLNESS:  Jesse Nolan is a 38 y.o. male, employee at [employer], who presents today regarding recurrent reflux symptoms off PPI, transient issues with voice change, and globus sensation.  The patient had remote issues with active reflux disease for which she underwent upper endoscopy June 06, 2010 with Dr. Juanda Chance.  He was found to have duodenitis.  Biopsies of the gastroesophageal junction revealed mild inflammation consistent with reflux.  Duodenal biopsies revealed peptic duodenitis.  He was placed on omeprazole.  He has been on that medication daily until last year.  After quitting the use of smokeless tobacco he decided to stop his PPI.  He did well for a while but subsequently developed breakthrough reflux symptoms for which he was taking on-demand acid suppressive agents over-the-counter.  In late July he developed problems with severe reflux and transient loss of voice as well as globus sensation.  He was seen virtually by his primary provider and placed on pantoprazole 40 mg daily.  Most symptoms resolved except for ongoing lump-like sensation in the throat.  The patient is a remote reformed smoker and does consume alcohol.  He is concerned about cancerous or precancerous conditions of the esophagus.  He requests endoscopic evaluation.  He does not seem to have true esophageal dysphagia.  No family history of esophageal cancer.  REVIEW OF SYSTEMS:  All non-GI ROS negative unless otherwise stated in the HPI except for allergies  Past Medical History:  Diagnosis Date  . Allergic rhinitis   . GERD (gastroesophageal reflux disease)     Past Surgical History:  Procedure Laterality Date  . COLONOSCOPY  2013   Dr. Candelaria Stagers  . UPPER GI ENDOSCOPY  2010   Dr. Dickie La  . WISDOM TOOTH EXTRACTION  2002    Social History Isack Lavalley  reports that he quit smoking about 14 years ago. He has never used smokeless tobacco. He reports current alcohol use of about 5.0 - 6.0  standard drinks of alcohol per week. He reports that he does not use drugs.  family history includes Colon cancer (age of onset: 71) in his maternal aunt.  No Known Allergies     PHYSICAL EXAMINATION: Vital signs: BP (!) 192/108   Pulse 84   Temp 98.8 F (37.1 C)   Ht 6\' 2"  (1.88 m)   Wt 263 lb (119.3 kg)   BMI 33.77 kg/m   Constitutional: generally well-appearing, no acute distress Psychiatric: alert and oriented x3, cooperative Eyes: extraocular movements intact, anicteric, conjunctiva pink Mouth: oral pharynx moist, no lesions Neck: supple no lymphadenopathy Cardiovascular: heart regular rate and rhythm, no murmur Lungs: clear to auscultation bilaterally Abdomen: soft, nontender, nondistended, no obvious ascites, no peritoneal signs, normal bowel sounds, no organomegaly Rectal: Omitted Extremities: no clubbing, cyanosis, or lower extremity edema bilaterally Skin: no lesions on visible extremities Neuro: No focal deficits.  Cranial nerves intact  ASSESSMENT:  1.  GERD.  Significant symptoms off PPI.  Longstanding.  Rule out Barrett's. 2.  Globus type sensation.  Likely related to GERD. 3.  Obesity  PLAN:  1.  Reflux precautions with attention to weight loss 2.  Schedule upper endoscopy to evaluate reflux symptoms and globus sensation.The nature of the procedure, as well as the risks, benefits, and alternatives were carefully and thoroughly reviewed with the patient. Ample time for discussion and questions allowed. The patient understood, was satisfied, and agreed to proceed. 3.  Continue pantoprazole 40 mg daily.  Medication effects, side effects and risks reviewed 4.  If no Barrett's on EGD then would recommend routine annual office follow-up to manage his chronic GERD.

## 2019-08-18 NOTE — Patient Instructions (Signed)
You have been scheduled for an endoscopy. Please follow written instructions given to you at your visit today. If you use inhalers (even only as needed), please bring them with you on the day of your procedure.   

## 2019-08-19 ENCOUNTER — Encounter: Payer: Self-pay | Admitting: Internal Medicine

## 2019-08-25 ENCOUNTER — Ambulatory Visit (INDEPENDENT_AMBULATORY_CARE_PROVIDER_SITE_OTHER): Payer: 59

## 2019-08-25 ENCOUNTER — Other Ambulatory Visit: Payer: Self-pay | Admitting: Internal Medicine

## 2019-08-25 DIAGNOSIS — Z1159 Encounter for screening for other viral diseases: Secondary | ICD-10-CM

## 2019-08-26 ENCOUNTER — Encounter: Payer: Self-pay | Admitting: Internal Medicine

## 2019-08-26 ENCOUNTER — Other Ambulatory Visit: Payer: Self-pay

## 2019-08-26 ENCOUNTER — Ambulatory Visit (AMBULATORY_SURGERY_CENTER): Payer: 59 | Admitting: Internal Medicine

## 2019-08-26 VITALS — BP 117/77 | HR 62 | Temp 98.5°F | Resp 11 | Ht 74.0 in | Wt 263.0 lb

## 2019-08-26 DIAGNOSIS — R0989 Other specified symptoms and signs involving the circulatory and respiratory systems: Secondary | ICD-10-CM

## 2019-08-26 DIAGNOSIS — K2100 Gastro-esophageal reflux disease with esophagitis, without bleeding: Secondary | ICD-10-CM | POA: Diagnosis not present

## 2019-08-26 LAB — SARS CORONAVIRUS 2 (TAT 6-24 HRS): SARS Coronavirus 2: NEGATIVE

## 2019-08-26 MED ORDER — SODIUM CHLORIDE 0.9 % IV SOLN
500.0000 mL | Freq: Once | INTRAVENOUS | Status: DC
Start: 2019-08-26 — End: 2019-08-26

## 2019-08-26 NOTE — Progress Notes (Signed)
A and O x3. Report to RN. Tolerated MAC anesthesia well.Teeth unchanged after procedure.

## 2019-08-26 NOTE — Op Note (Signed)
[facility] Patient Name: Jesse LenisBrian Pacifico Procedure Date: 08/26/2019 10:14 AM MRN: [id number] Endoscopist: Wilhemina BonitoJohn N. Marina GoodellPerry , MD Age: [age] Referring MD:  Date of Birth: April 21, 1981 Gender: Male Account #: [id number] Procedure:                Upper GI endoscopy Indications:              Esophageal reflux. Significant symptoms off PPI.                            Globus sensation Medicines:                Monitored Anesthesia Care Procedure:                Pre-Anesthesia Assessment:                           - Prior to the procedure, a History and Physical                            was performed, and patient medications and                            allergies were reviewed. The patient's tolerance of                            previous anesthesia was also reviewed. The risks                            and benefits of the procedure and the sedation                            options and risks were discussed with the patient.                            All questions were answered, and informed consent                            was obtained. Prior Anticoagulants: The patient has                            taken no previous anticoagulant or antiplatelet                            agents. ASA Grade Assessment: I - A normal, healthy                            patient. After reviewing the risks and benefits,                            the patient was deemed in satisfactory condition to                            undergo the procedure.  After obtaining informed consent, the endoscope was                            passed under direct vision. Throughout the                            procedure, the patient's blood pressure, pulse, and                            oxygen saturations were monitored continuously. The                            Endoscope was introduced through the mouth, and                            advanced to the second part of duodenum. The upper                         GI endoscopy was accomplished without difficulty.                            The patient tolerated the procedure well. Scope In: Scope Out: Findings:                 The esophagus was normal.                           The stomach was normal.                           The examined duodenum was normal.                           The cardia and gastric fundus were normal on                            retroflexion. Complications:            No immediate complications. Estimated Blood Loss:     Estimated blood loss: none. Impression:               1. Normal EGD                           2. Unremarkable posterior pharynx                           3. GERD. Recommendation:           1. Reflux precautions                           2. Continue pantoprazole 40 mg daily                           3. Routine GI follow-up 1 year.                           4. Resume general medical care with  your PCP Wilhemina Bonito. Marina Goodell, MD 08/26/2019 10:36:55 AM This report has been signed electronically.

## 2019-08-26 NOTE — Patient Instructions (Signed)
Handout given for GERD.  YOU HAD AN ENDOSCOPIC PROCEDURE TODAY AT THE [facility]:   Refer to the procedure report that was given to you for any specific questions about what was found during the examination.  If the procedure report does not answer your questions, please call your gastroenterologist to clarify.  If you requested that your care partner not be given the details of your procedure findings, then the procedure report has been included in a sealed envelope for you to review at your convenience later.  YOU SHOULD EXPECT: Some feelings of bloating in the abdomen. Passage of more gas than usual.  Walking can help get rid of the air that was put into your GI tract during the procedure and reduce the bloating. If you had a lower endoscopy (such as a colonoscopy or flexible sigmoidoscopy) you may notice spotting of blood in your stool or on the toilet paper. If you underwent a bowel prep for your procedure, you may not have a normal bowel movement for a few days.  Please Note:  You might notice some irritation and congestion in your nose or some drainage.  This is from the oxygen used during your procedure.  There is no need for concern and it should clear up in a day or so.  SYMPTOMS TO REPORT IMMEDIATELY:   Following upper endoscopy (EGD)  Vomiting of blood or coffee ground material  New chest pain or pain under the shoulder blades  Painful or persistently difficult swallowing  New shortness of breath  Fever of 100F or higher  Black, tarry-looking stools  For urgent or emergent issues, a gastroenterologist can be reached at any hour by calling [phone].   DIET:  We do recommend a small meal at first, but then you may proceed to your regular diet.  Drink plenty of fluids but you should avoid alcoholic beverages for 24 hours.  ACTIVITY:  You should plan to take it easy for the rest of today and you should NOT DRIVE or use heavy machinery until tomorrow (because of  the sedation medicines used during the test).    FOLLOW UP: Our staff will call the number listed on your records 48-72 hours following your procedure to check on you and address any questions or concerns that you may have regarding the information given to you following your procedure. If we do not reach you, we will leave a message.  We will attempt to reach you two times.  During this call, we will ask if you have developed any symptoms of COVID 19. If you develop any symptoms (ie: fever, flu-like symptoms, shortness of breath, cough etc.) before then, please call [phone].  If you test positive for Covid 19 in the 2 weeks post procedure, please call and report this information to us.    If any biopsies were taken you will be contacted by phone or by letter within the next 1-3 weeks.  Please call us at [phone] if you have not heard about the biopsies in 3 weeks.    SIGNATURES/CONFIDENTIALITY: You and/or your care partner have signed paperwork which will be entered into your electronic medical record.  These signatures attest to the fact that that the information above on your After Visit Summary has been reviewed and is understood.  Full responsibility of the confidentiality of this discharge information lies with you and/or your care-partner. 

## 2019-08-26 NOTE — Progress Notes (Signed)
Pt's states no medical or surgical changes since previsit or office visit.   Cw vitals, HC IV and JB temp

## 2019-08-28 ENCOUNTER — Telehealth: Payer: Self-pay | Admitting: *Deleted

## 2019-08-28 ENCOUNTER — Telehealth: Payer: Self-pay

## 2019-08-28 NOTE — Telephone Encounter (Signed)
Covid-19 screening questions   Do you now or have you had a fever in the last 14 days? No. Do you have any respiratory symptoms of shortness of breath or cough now or in the last 14 days? No.  Do you have any family members or close contacts with diagnosed or suspected Covid-19 in the past 14 days? No.  Have you been tested for Covid-19 and found to be positive? No.       Follow up Call-  Call back number 08/26/2019  Post procedure Call Back phone  # [phone]  Permission to leave phone message Yes  Some recent data might be hidden     Patient questions:  Do you have a fever, pain , or abdominal swelling? No. Pain Score  0 *  Have you tolerated food without any problems? Yes.    Have you been able to return to your normal activities? Yes.    Do you have any questions about your discharge instructions: Diet   No. Medications  No. Follow up visit  No.  Do you have questions or concerns about your Care? Yes.    Actions: * If pain score is 4 or above: No action needed, pain <4.

## 2019-08-28 NOTE — Telephone Encounter (Signed)
Follow up call made, wrong number in pt chart.

## 2019-10-01 ENCOUNTER — Telehealth: Payer: Self-pay | Admitting: Internal Medicine

## 2019-10-01 MED ORDER — PANTOPRAZOLE SODIUM 40 MG PO TBEC: 40.0000 mg | DELAYED_RELEASE_TABLET | Freq: Every day | ORAL | 6 refills | Status: DC

## 2019-10-01 NOTE — Telephone Encounter (Signed)
Refilled Pantoprazole 

## 2019-10-01 NOTE — Telephone Encounter (Signed)
Patient needs prescription for pantoprazole 40mg . He stated that his PCP used to prescribe it but Dr. told him that he will take over prescription. Please send it to [facility] on [address] in [place].

## 2020-05-12 ENCOUNTER — Other Ambulatory Visit: Payer: Self-pay | Admitting: Internal Medicine

## 2020-09-07 ENCOUNTER — Other Ambulatory Visit: Payer: Self-pay | Admitting: Internal Medicine

## 2020-11-14 ENCOUNTER — Other Ambulatory Visit: Payer: Self-pay | Admitting: Internal Medicine

## 2021-05-04 IMAGING — DX DG FINGER THUMB 2+V*L*
3 series · 3 of 3 positions shown · non-contrast
Comparison: None.

CLINICAL DATA: Crush injury to the thumb, initial encounter

EXAM:
LEFT THUMB 2+V

[thumb mlo]
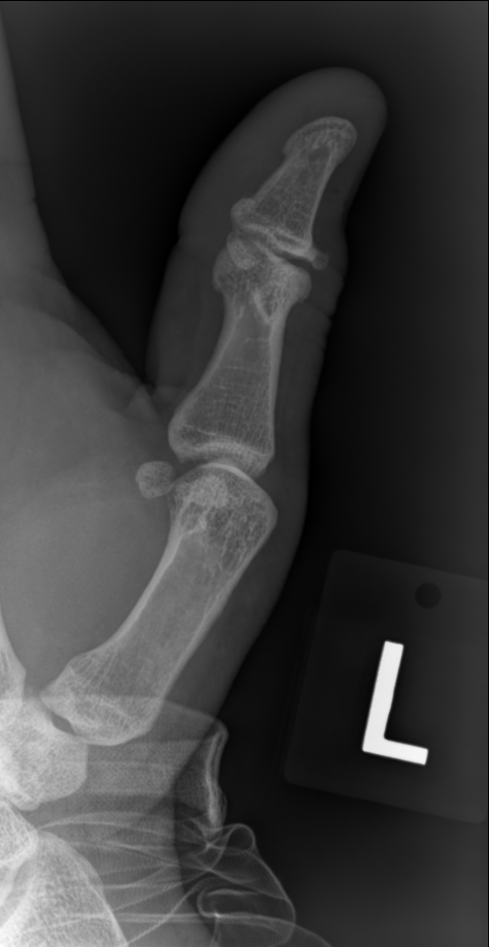

[thumb lat]
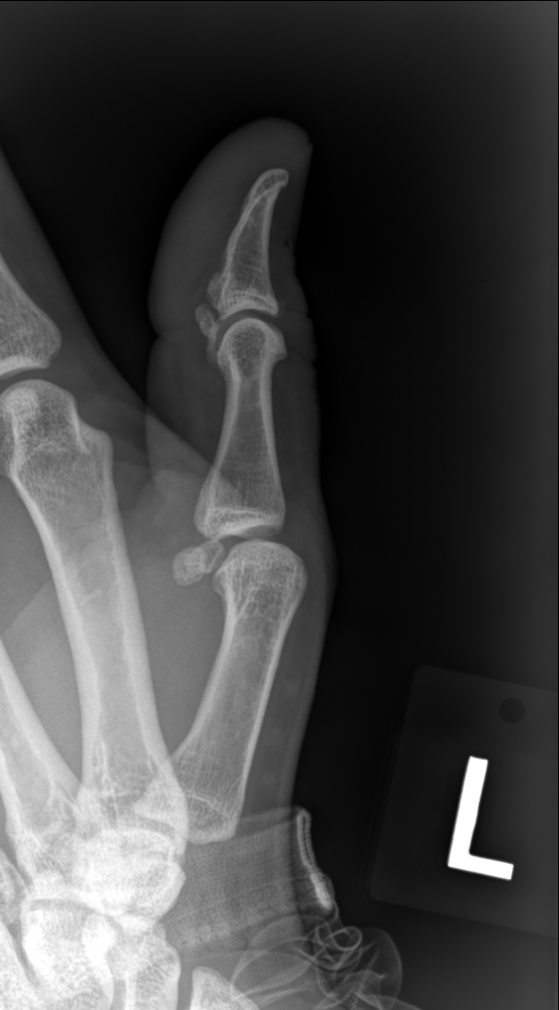

[thumb ap]
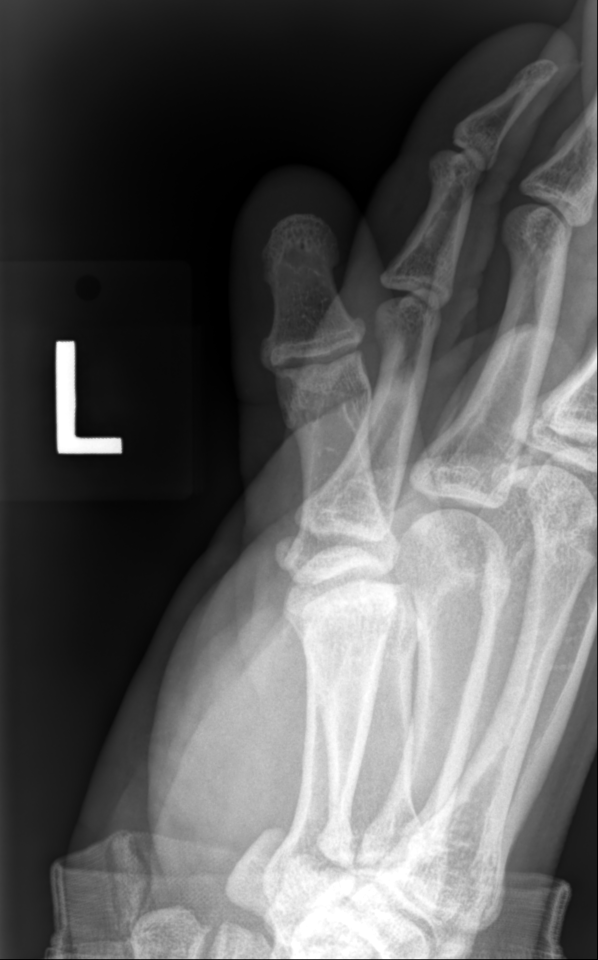

[3 of 3 positions shown; findings below may reference images not displayed]

FINDINGS: Mild degenerative changes of the interphalangeal joint are seen. No
acute fracture or dislocation is seen. No gross soft tissue
abnormality is noted.
IMPRESSION: No acute abnormality noted.

## 2022-11-16 ENCOUNTER — Ambulatory Visit: Payer: 59 | Admitting: Gastroenterology

## 2022-12-19 ENCOUNTER — Encounter: Payer: Self-pay | Admitting: Gastroenterology

## 2022-12-19 ENCOUNTER — Ambulatory Visit: Payer: 59 | Admitting: Gastroenterology

## 2022-12-19 VITALS — BP 138/86 | HR 87 | Ht 74.0 in | Wt 272.0 lb

## 2022-12-19 DIAGNOSIS — K602 Anal fissure, unspecified: Secondary | ICD-10-CM | POA: Diagnosis not present

## 2022-12-19 DIAGNOSIS — K648 Other hemorrhoids: Secondary | ICD-10-CM

## 2022-12-19 MED ORDER — HYDROCORTISONE ACETATE 25 MG RE SUPP
25.0000 mg | Freq: Every evening | RECTAL | 1 refills | Status: AC
Start: 2022-12-19 — End: ?

## 2022-12-19 MED ORDER — AMBULATORY NON FORMULARY MEDICATION
3 refills | Status: DC
Start: 2022-12-19 — End: 2022-12-20

## 2022-12-19 NOTE — Patient Instructions (Signed)
We have sent the following medications to your pharmacy for you to pick up at your convenience: Nitroglycerin 0.125% gel to [facility]. You should apply a pea size amount to your rectum 2-3 times daily x 6-8 weeks.  Hydrocortisone suppository nightly for 7 days.   _______________________________________________________  If your blood pressure at your visit was 140/90 or greater, please contact your primary care physician to follow up on this.  _______________________________________________________  If you are age 41 or older, your body mass index should be between 23-30. Your Body mass index is 34.92 kg/m. If this is out of the aforementioned range listed, please consider follow up with your Primary Care Provider.  If you are age 44 or younger, your body mass index should be between 19-25. Your Body mass index is 34.92 kg/m. If this is out of the aformentioned range listed, please consider follow up with your Primary Care Provider.   ________________________________________________________  The [facility] providers would like to encourage you to use [facility] to communicate with providers for non-urgent requests or questions.  Due to long hold times on the telephone, sending your provider a message by [facility] may be a faster and more efficient way to get a response.  Please allow 48 business hours for a response.  Please remember that this is for non-urgent requests.  _______________________________________________________

## 2022-12-19 NOTE — Progress Notes (Signed)
12/19/2022 Jesse Nolan [id number] 11-07-1980   HISTORY OF PRESENT ILLNESS: This is a 41 year old male who is a patient of Dr. Blanch Media previously seen by him for upper GI issues.  Patient had a colonoscopy with Dr. Earlean Shawl in 2013 that he says was unremarkable.  He says that he had some hemorrhoids banded and had a history of a fissure back then.  He tells me that he has been having issues with bleeding and discomfort in his bottom again that started probably about November.  Things are actually improved now.  He saw some bright red blood on a couple of occasions.  Has not been using anything for his symptoms.  Overall he moves his bowels well without any straining.  No abdominal pain.  Past Medical History:  Diagnosis Date   Allergic rhinitis    Anal fissure    GERD (gastroesophageal reflux disease)    Past Surgical History:  Procedure Laterality Date   COLONOSCOPY  2013   Dr. Cher Nakai   UPPER GI ENDOSCOPY  2010   Dr. Maurene Capes   WISDOM TOOTH EXTRACTION  2002    reports that he has quit smoking. He quit smokeless tobacco use about 5 years ago.  His smokeless tobacco use included chew. He reports current alcohol use of about 5.0 - 6.0 standard drinks of alcohol per week. He reports that he does not use drugs. family history includes Colon cancer (age of onset: 50) in his maternal aunt. No Known Allergies    Outpatient Encounter Medications as of 12/19/2022  Medication Sig   pantoprazole (PROTONIX) 40 MG tablet Take 1 tablet (40 mg total) by mouth daily. Office visit for further refills   cetirizine (ZYRTEC) 10 MG tablet Take 10 mg by mouth daily. (Patient not taking: Reported on 12/19/2022)   KRILL OIL OMEGA-3 PO Take by mouth. (Patient not taking: Reported on 12/19/2022)   Multiple Vitamins-Minerals (MULTIVITAMIN WITH MINERALS) tablet Take 1 tablet by mouth daily. (Patient not taking: Reported on 12/19/2022)   [DISCONTINUED] omeprazole (PRILOSEC) 40 MG capsule    No  facility-administered encounter medications on file as of 12/19/2022.     REVIEW OF SYSTEMS  : All other systems reviewed and negative except where noted in the History of Present Illness.   PHYSICAL EXAM: BP 138/86   Pulse 87   Ht 6\' 2"  (1.88 m)   Wt 272 lb (123.4 kg)   BMI 34.92 kg/m  General: Well developed white male in no acute distress Head: Normocephalic and atraumatic Eyes:  Sclerae anicteric, conjunctiva pink. Ears: Normal auditory acuity Abdomen: Soft, non-distended.  BS present.  Non-tender. Rectal: One non-inflamed external hemorrhoid noted.  He does have a small posterior anal fissure.  Anoscopy performed and revealed enlarged, but noninflamed and nonbleeding internal hemorrhoids. Musculoskeletal: Symmetrical with no gross deformities  Skin: No lesions on visible extremities Extremities: No edema  Neurological: Alert oriented x 4, grossly non-focal Psychological:  Alert and cooperative. Normal mood and affect  ASSESSMENT AND PLAN: *Rectal discomfort and bleeding:  Symptoms now improved.  He does have enlarged internal hemorrhoids, but nothing with stigmata of recent bleeding.  He has a small posterior anal fissure.  Will prescribe hydrocortisone suppositories to use at bedtime for a week at a time as needed.  Will treat the fissure with nitro gel 0.125% 2-3 times daily for the next several weeks.  Prescriptions sent to pharmacy.  He will update [place] on his status.     CC:  Berenice Primas, NP

## 2022-12-20 ENCOUNTER — Telehealth: Payer: Self-pay | Admitting: Gastroenterology

## 2022-12-20 MED ORDER — AMBULATORY NON FORMULARY MEDICATION
3 refills | Status: AC
Start: 2022-12-20 — End: ?

## 2022-12-20 NOTE — Telephone Encounter (Signed)
Faxed script again to [facility].

## 2022-12-20 NOTE — Addendum Note (Signed)
Addended by: Alonza Bogus D on: 12/20/2022 01:04 PM   Modules accepted: Level of Service

## 2022-12-20 NOTE — Telephone Encounter (Signed)
Patient wife called regarding the medication Nitroglycerin gel, states that it was not sent yet. Please Advise.
